# Patient Record
Sex: Female | Born: 1970
Health system: Southern US, Community
[De-identification: ages and names within clinical notes are randomized; demographics above are authoritative.]

## PROBLEM LIST (undated history)

## (undated) DIAGNOSIS — D649 Anemia, unspecified: Secondary | ICD-10-CM

## (undated) HISTORY — DX: Anemia, unspecified: D64.9

---

## 1999-01-28 ENCOUNTER — Other Ambulatory Visit: Admission: RE | Admit: 1999-01-28 | Discharge: 1999-01-28 | Payer: Self-pay | Admitting: Obstetrics and Gynecology

## 1999-07-05 ENCOUNTER — Inpatient Hospital Stay (HOSPITAL_COMMUNITY): Admission: AD | Admit: 1999-07-05 | Discharge: 1999-07-05 | Payer: Self-pay | Admitting: *Deleted

## 2006-12-14 ENCOUNTER — Emergency Department (HOSPITAL_COMMUNITY): Admission: EM | Admit: 2006-12-14 | Discharge: 2006-12-14 | Payer: Self-pay | Admitting: *Deleted

## 2008-09-02 ENCOUNTER — Emergency Department (HOSPITAL_COMMUNITY): Admission: EM | Admit: 2008-09-02 | Discharge: 2008-09-02 | Payer: Self-pay | Admitting: Emergency Medicine

## 2009-02-20 ENCOUNTER — Inpatient Hospital Stay (HOSPITAL_COMMUNITY): Admission: AD | Admit: 2009-02-20 | Discharge: 2009-02-21 | Payer: Self-pay | Admitting: Obstetrics and Gynecology

## 2010-07-08 ENCOUNTER — Emergency Department (HOSPITAL_COMMUNITY)
Admission: EM | Admit: 2010-07-08 | Discharge: 2010-07-08 | Payer: Self-pay | Source: Home / Self Care | Admitting: Emergency Medicine

## 2010-09-15 LAB — CK TOTAL AND CKMB (NOT AT ARMC): CK, MB: 0.6 ng/mL (ref 0.3–4.0)

## 2010-09-15 LAB — D-DIMER, QUANTITATIVE: D-Dimer, Quant: 0.3 ug/mL-FEU (ref 0.00–0.48)

## 2010-09-15 LAB — POCT PREGNANCY, URINE: Preg Test, Ur: NEGATIVE

## 2010-09-15 LAB — BASIC METABOLIC PANEL
BUN: 10 mg/dL (ref 6–23)
Chloride: 105 mEq/L (ref 96–112)
Creatinine, Ser: 0.77 mg/dL (ref 0.4–1.2)
GFR calc non Af Amer: >60 mL/min (ref >60)
Glucose, Bld: 97 mg/dL (ref 70–99)

## 2010-09-15 LAB — CBC
MCHC: 34.4 g/dL (ref 30.0–36.0)
MCV: 78.8 fL (ref 78.0–100.0)
Platelets: 322 10*3/uL (ref 150–400)
RDW: 14.7 % (ref 11.5–15.5)
WBC: 10.1 10*3/uL (ref 4.0–10.5)

## 2010-09-15 LAB — DIFFERENTIAL
Eosinophils Absolute: 0.1 10*3/uL (ref 0.0–0.7)
Eosinophils Relative: 1 % (ref 0–5)
Lymphs Abs: 1.4 10*3/uL (ref 0.7–4.0)
Monocytes Absolute: 1.3 10*3/uL — ABNORMAL HIGH (ref 0.1–1.0)
Monocytes Relative: 12 % (ref 3–12)

## 2010-09-15 LAB — URINALYSIS, ROUTINE W REFLEX MICROSCOPIC
Bilirubin Urine: NEGATIVE
Glucose, UA: NEGATIVE mg/dL
Hgb urine dipstick: NEGATIVE
Specific Gravity, Urine: 1.022 (ref 1.005–1.030)
pH: 6.5 (ref 5.0–8.0)

## 2010-10-11 LAB — ABO/RH: ABO/RH(D): O POS

## 2010-10-11 LAB — POCT PREGNANCY, URINE: Preg Test, Ur: POSITIVE

## 2010-10-11 LAB — WET PREP, GENITAL: Yeast Wet Prep HPF POC: NONE SEEN

## 2010-10-11 LAB — GC/CHLAMYDIA PROBE AMP, GENITAL
Chlamydia, DNA Probe: NEGATIVE
GC Probe Amp, Genital: NEGATIVE

## 2011-03-28 IMAGING — US US OB COMP LESS 14 WK
1 series · 14 of 21 positions shown · non-contrast
Comparison: none

CLINICAL DATA: Abdominal pain.  Bleeding.

OBSTETRIC <14 WK ULTRASOUND
TECHNIQUE: Transabdominal ultrasound was performed for evaluation
of the gestation as well as the maternal uterus and adnexal
regions.

[Series 1: us ob comp less 14 wk · 0.22mm/px · 14 of 21 slices shown]
[im 1/21]
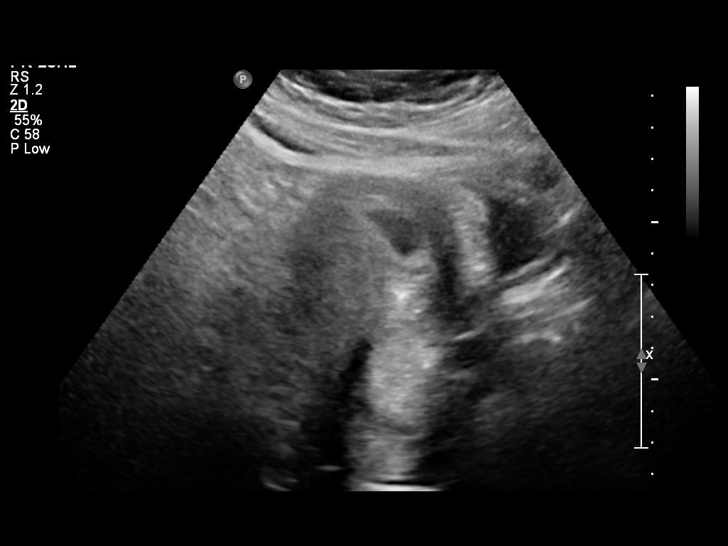
[im 3/21]
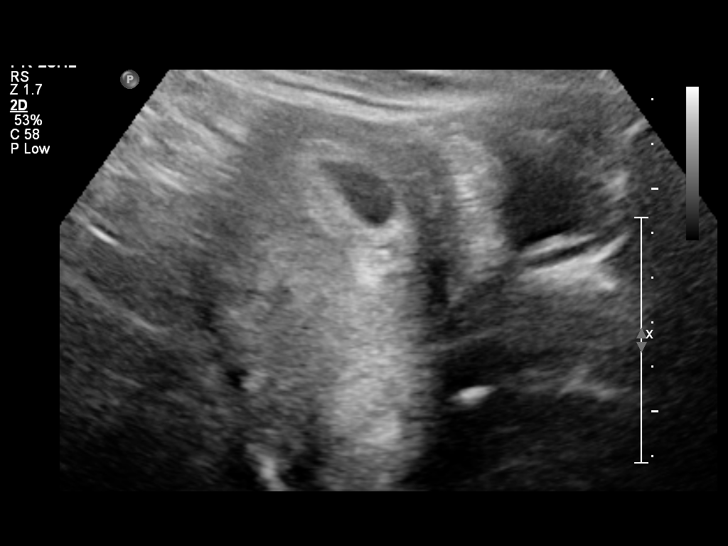
[im 4/21]
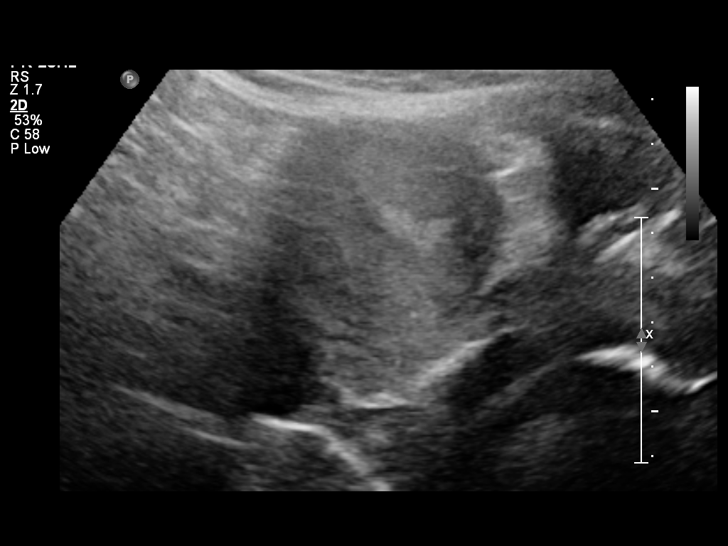
[im 6/21]
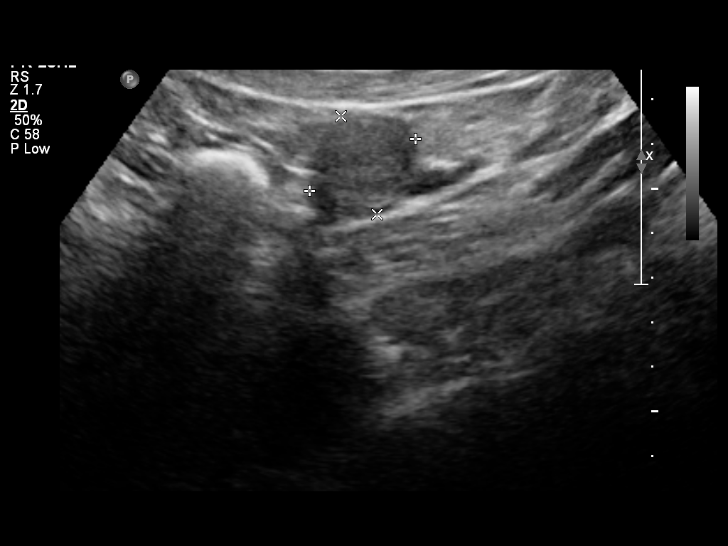
[im 7/21]
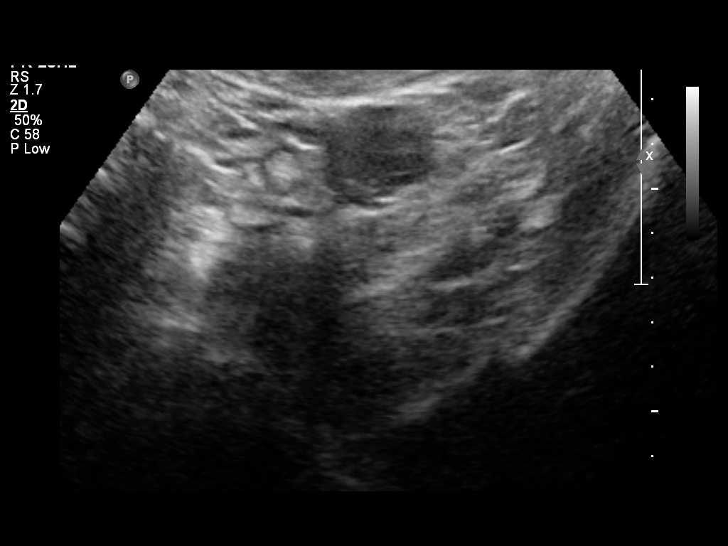
[im 9/21]
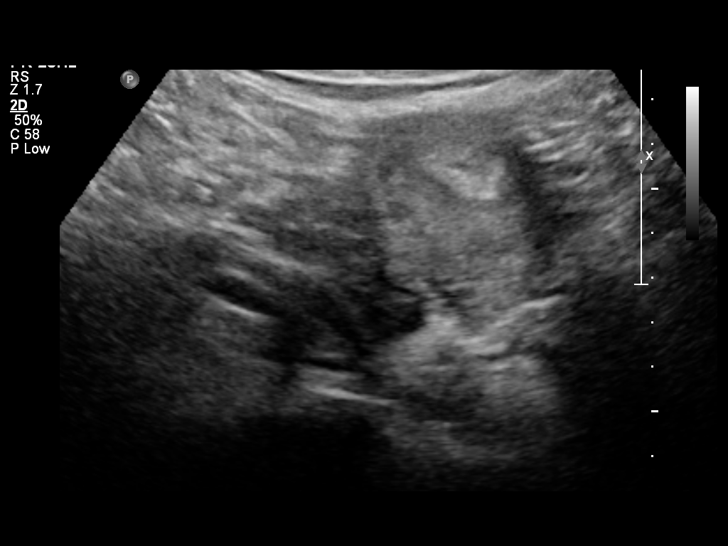
[im 10/21]
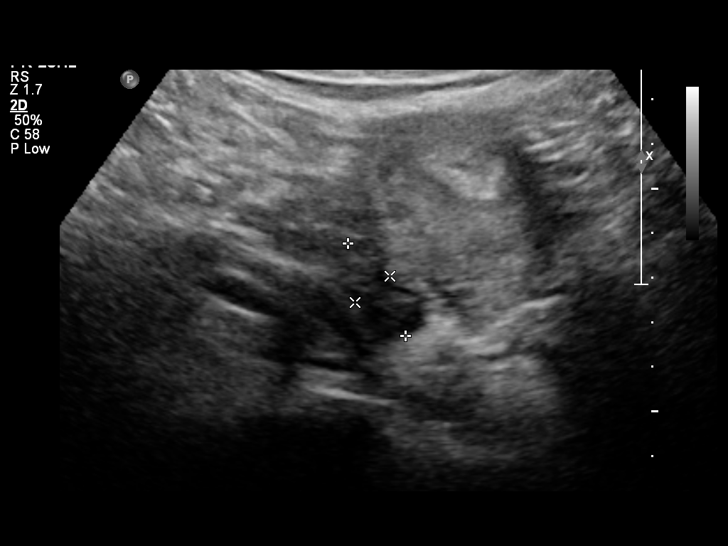
[im 12/21]
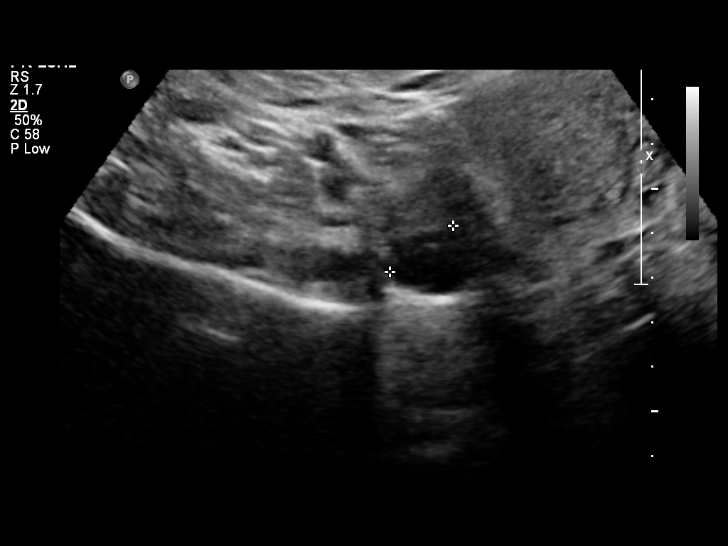
[im 13/21]
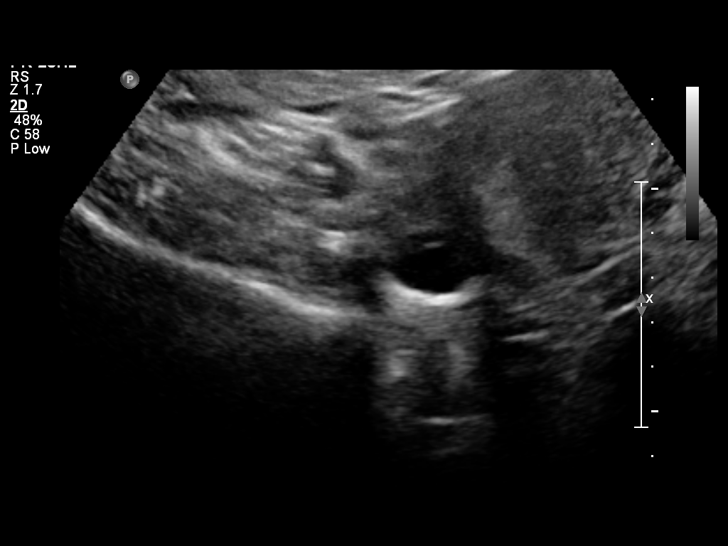
[im 15/21]
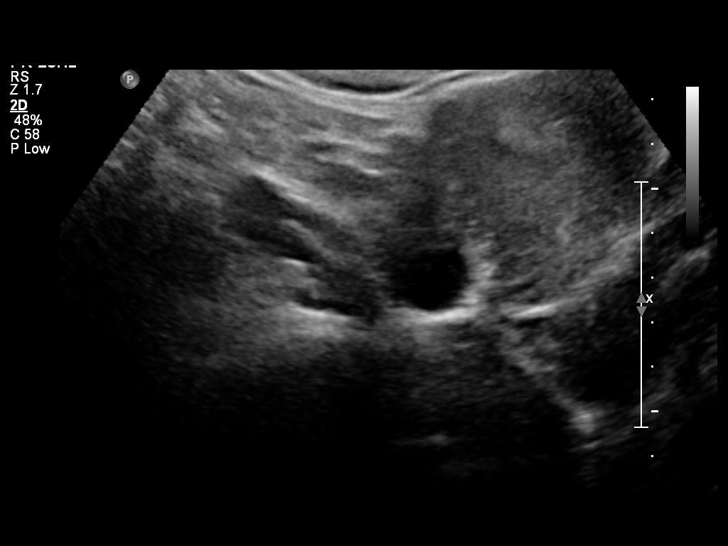
[im 16/21]
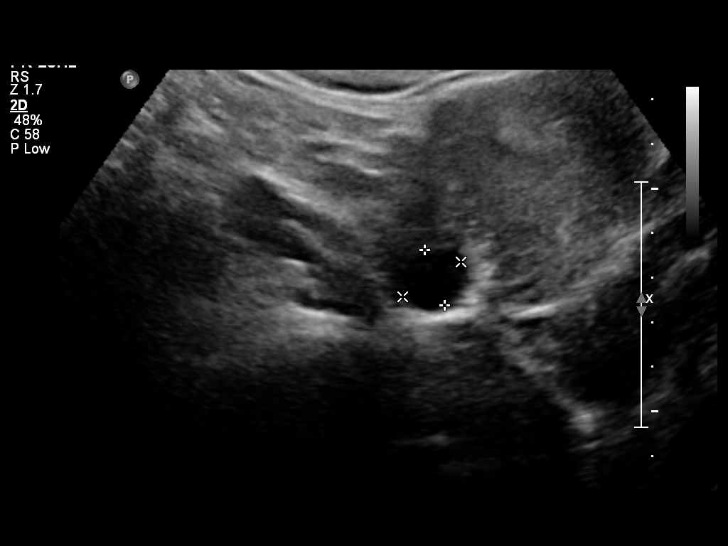
[im 18/21]
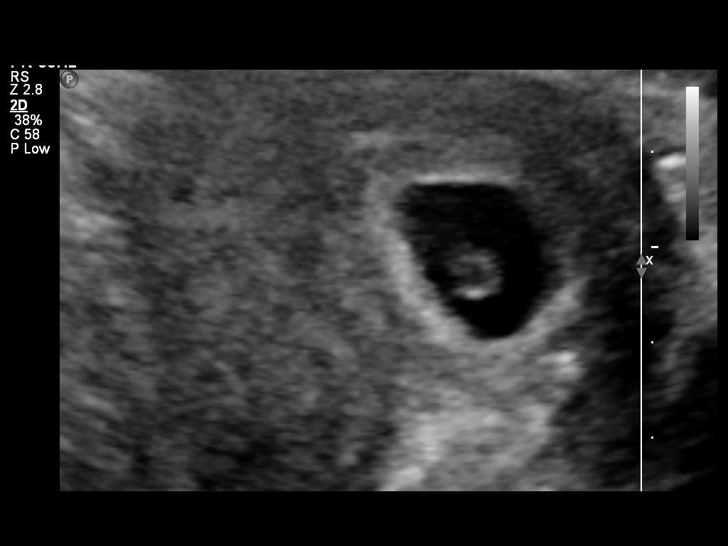
[im 19/21]
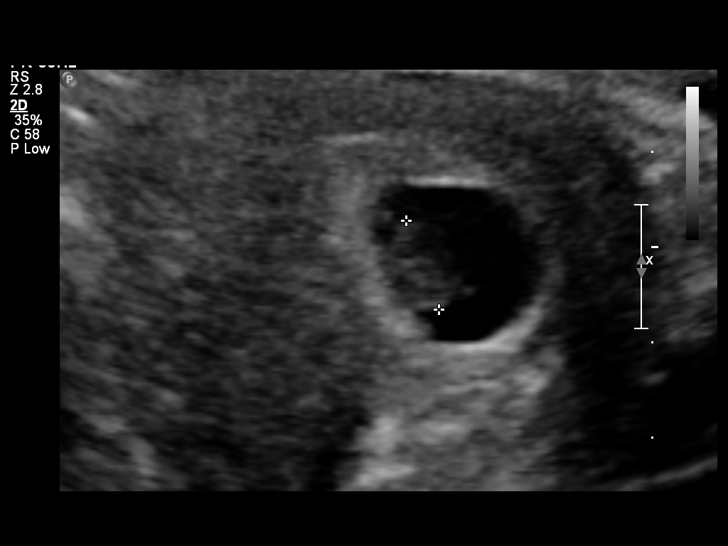
[im 21/21]
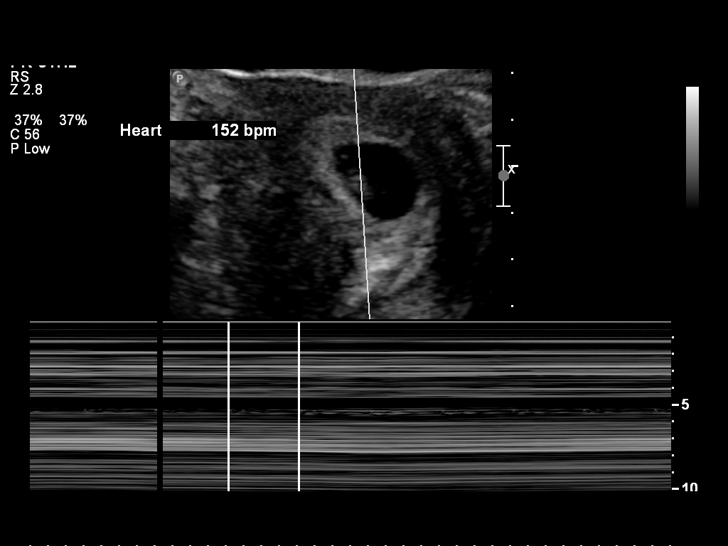

[14 of 21 positions shown; findings below may reference images not displayed]

FINDINGS: Quantitative beta HCG is unknown.  Single viable intrauterine
pregnancy is identified with fetal heart tones of 152 beats per
minute.  The crown-rump length of the fetus is 10 mm, corresponding
with 7 weeks 1 day.  Estimated date of delivery is 10/19/2009.

No subchorionic hemorrhage.  Physiologic appearance of the ovaries.
No free fluid.
IMPRESSION: Single viable intrauterine pregnancy with estimated gestational age
recommended, or sooner if clinically indicated.

## 2017-03-10 DIAGNOSIS — S138XXA Sprain of joints and ligaments of other parts of neck, initial encounter: Secondary | ICD-10-CM | POA: Diagnosis not present

## 2017-07-18 DIAGNOSIS — N760 Acute vaginitis: Secondary | ICD-10-CM | POA: Diagnosis not present

## 2017-07-18 DIAGNOSIS — M62838 Other muscle spasm: Secondary | ICD-10-CM | POA: Diagnosis not present

## 2017-08-29 DIAGNOSIS — R05 Cough: Secondary | ICD-10-CM | POA: Diagnosis not present

## 2017-11-02 ENCOUNTER — Ambulatory Visit (INDEPENDENT_AMBULATORY_CARE_PROVIDER_SITE_OTHER): Payer: 59 | Admitting: Urgent Care

## 2017-11-02 ENCOUNTER — Encounter: Payer: Self-pay | Admitting: Urgent Care

## 2017-11-02 VITALS — BP 120/80 | HR 79 | Temp 98.4°F | Resp 18 | Ht 64.5 in | Wt 216.2 lb

## 2017-11-02 DIAGNOSIS — N898 Other specified noninflammatory disorders of vagina: Secondary | ICD-10-CM

## 2017-11-02 DIAGNOSIS — M25469 Effusion, unspecified knee: Secondary | ICD-10-CM

## 2017-11-02 DIAGNOSIS — E669 Obesity, unspecified: Secondary | ICD-10-CM

## 2017-11-02 DIAGNOSIS — Z6836 Body mass index (BMI) 36.0-36.9, adult: Secondary | ICD-10-CM

## 2017-11-02 LAB — POCT WET + KOH PREP
TRICH BY WET PREP: ABSENT
YEAST BY KOH: ABSENT
YEAST BY WET PREP: ABSENT

## 2017-11-02 MED ORDER — METRONIDAZOLE 0.75 % VA GEL: 1.0000 | Freq: Two times a day (BID) | VAGINAL | 0 refills | Status: DC

## 2017-11-02 NOTE — Progress Notes (Signed)
MRN: [id number] DOB: 19-Sep-1970  Subjective:   Melissa Heath is a 47 y.o. female presenting for 1 week history of clear vaginal discharge.  She also reports several week history of intermittent focal swelling over her left knee.  Denies fever, nausea, vomiting, belly pain, joint swelling, redness, trauma, falls, history of arthritis, rashes, dysuria, urinary frequency, genital rash, flank pain, belly pain.  Patient reports that she has not been sexually active in some time.  She has had STI testing as soon as December of this past year and testing was negative.  She denies history of chronic BV.  She has a sedentary work style and although she denies pain of her left knee she does admit that she has stiffness when she sits for long periods of time.  He does not exercise.  She denies smoking cigarettes and has about 3 drinks of alcohol per week.  Melissa Heath is not currently taking any medications and has No Known Allergies.  Melissa Heath denies past medical and surgical history.  Her family history is positive for breast cancer in her mother and sister, lung cancer her maternal grandmother, pancreatic cancer in her paternal grandfather.  Patient's mother also has a history of severe gout in left knee.  Objective:   Vitals: BP 120/80   Pulse 79   Temp 98.4 F (36.9 C) (Oral)   Resp 18   Ht 5' 4.5" (1.638 m)   Wt 216 lb 3.2 oz (98.1 kg)   LMP 10/26/2017   SpO2 97%   BMI 36.54 kg/m   Physical Exam  Constitutional: She is oriented to person, place, and time. She appears well-developed and well-nourished.  HENT:  Mouth/Throat: Oropharynx is clear and moist.  Eyes: Pupils are equal, round, and reactive to light. EOM are normal. No scleral icterus.  Neck: Normal range of motion. Neck supple. No thyromegaly present.  Cardiovascular: Normal rate, regular rhythm and intact distal pulses. Exam reveals no gallop and no friction rub.  No murmur heard. Pulmonary/Chest: No respiratory distress. She has no wheezes.  She has no rales.  Abdominal: Soft. Bowel sounds are normal. She exhibits no distension and no mass. There is no tenderness. There is no rebound and no guarding.  Musculoskeletal:       Left knee: She exhibits normal range of motion, no swelling, no effusion, no ecchymosis, no deformity, no erythema, normal alignment, normal patellar mobility and no bony tenderness. No tenderness found.  Neurological: She is alert and oriented to person, place, and time.  Skin: Skin is warm and dry.  Psychiatric: She has a normal mood and affect.   Results for orders placed or performed in visit on 11/02/17 (from the past 24 hour(s))  POCT Wet + KOH Prep     Status: Abnormal   Collection Time: 11/02/17  8:25 AM  Result Value Ref Range   Yeast by KOH Absent Absent   Yeast by wet prep Absent Absent   WBC by wet prep Few Few   Clue Cells Wet Prep HPF POC Few (A) None   Trich by wet prep Absent Absent   Bacteria Wet Prep HPF POC Many (A) Few   Epithelial Cells By [employer]) Many (A) None, Few, Too numerous to count   RBC,UR,HPF,POC None None RBC/hpf    Assessment and Plan :   Vaginal discharge - Plan: POCT Wet + KOH Prep  Knee swelling  Class 2 obesity without serious comorbidity with body mass index (BMI) of 36.0 to 36.9  in adult, unspecified obesity type  Patient would like to use Flagyl vaginal inserts.  She declined STI testing.  Counseled that she should start exercise that is not burdensome to her knees including yoga, swimming, elliptical, bicycling.  She can use Tylenol and ibuprofen for any pain that she ends up developing.  For now we will hold off on any lab test.  Patient plans on returning to clinic for annual physical.  Melissa Bamberg, PA-C Primary [facility] at [facility] [phone] 11/02/2017  8:18 AM

## 2017-11-02 NOTE — Addendum Note (Signed)
Addended by: Wallis Bamberg on: 11/02/2017 08:58 AM   Modules accepted: Level of Service

## 2017-11-02 NOTE — Patient Instructions (Addendum)
Hydrate well with at least 2 liters (1 gallon) of water daily.    Vaginitis Vaginitis is a condition in which the vaginal tissue swells and becomes red (inflamed). This condition is most often caused by a change in the normal balance of bacteria and yeast that live in the vagina. This change causes an overgrowth of certain bacteria or yeast, which causes the inflammation. There are different types of vaginitis, but the most common types are:  Bacterial vaginosis.  Yeast infection (candidiasis).  Trichomoniasis vaginitis. This is a sexually transmitted disease (STD).  Viral vaginitis.  Atrophic vaginitis.  Allergic vaginitis.  What are the causes? The cause of this condition depends on the type of vaginitis. It can be caused by:  Bacteria (bacterial vaginosis).  Yeast, which is a fungus (yeast infection).  A parasite (trichomoniasis vaginitis).  A virus (viral vaginitis).  Low hormone levels (atrophic vaginitis). Low hormone levels can occur during pregnancy, breastfeeding, or after menopause.  Irritants, such as bubble baths, scented tampons, and feminine sprays (allergic vaginitis).  Other factors can change the normal balance of the yeast and bacteria that live in the vagina. These include:  Antibiotic medicines.  Poor hygiene.  Diaphragms, vaginal sponges, spermicides, birth control pills, and intrauterine devices (IUD).  Sex.  Infection.  Uncontrolled diabetes.  A weakened defense (immune) system.  What increases the risk? This condition is more likely to develop in women who:  Smoke.  Use vaginal douches, scented tampons, or scented sanitary pads.  Wear tight-fitting pants.  Wear thong underwear.  Use oral birth control pills or an IUD.  Have sex without a condom.  Have multiple sex partners.  Have an STD.  Frequently use the spermicide nonoxynol-9.  Eat lots of foods high in sugar.  Have uncontrolled diabetes.  Have low estrogen  levels.  Have a weakened immune system from an immune disorder or medical treatment.  Are pregnant or breastfeeding.  What are the signs or symptoms? Symptoms vary depending on the cause of the vaginitis. Common symptoms include:  Abnormal vaginal discharge. ? The discharge is white, gray, or yellow with bacterial vaginosis. ? The discharge is thick, white, and cheesy with a yeast infection. ? The discharge is frothy and yellow or greenish with trichomoniasis.  A bad vaginal smell. The smell is fishy with bacterial vaginosis.  Vaginal itching, pain, or swelling.  Sex that is painful.  Pain or burning when urinating.  Sometimes there are no symptoms. How is this diagnosed? This condition is diagnosed based on your symptoms and medical history. A physical exam, including a pelvic exam, will also be done. You may also have other tests, including:  Tests to determine the pH level (acidity or alkalinity) of your vagina.  A whiff test, to assess the odor that results when a sample of your vaginal discharge is mixed with a potassium hydroxide solution.  Tests of vaginal fluid. A sample will be examined under a microscope.  How is this treated? Treatment varies depending on the type of vaginitis you have. Your treatment may include:  Antibiotic creams or pills to treat bacterial vaginosis and trichomoniasis.  Antifungal medicines, such as vaginal creams or suppositories, to treat a yeast infection.  Medicine to ease discomfort if you have viral vaginitis. Your sexual partner should also be treated.  Estrogen delivered in a cream, pill, suppository, or vaginal ring to treat atrophic vaginitis. If vaginal dryness occurs, lubricants and moisturizing creams may help. You may need to avoid scented soaps, sprays, or douches.  Stopping use of a product that is causing allergic vaginitis. Then using a vaginal cream to treat the symptoms.  Follow these instructions at  home: Lifestyle  Keep your genital area clean and dry. Avoid soap, and only rinse the area with water.  Do not douche or use tampons until your health care provider says it is okay to do so. Use sanitary pads, if needed.  Do not have sex until your health care provider approves. When you can return to sex, practice safe sex and use condoms.  Wipe from front to back. This avoids the spread of bacteria from the rectum to the vagina. General instructions  Take over-the-counter and prescription medicines only as told by your health care provider.  If you were prescribed an antibiotic medicine, take or use it as told by your health care provider. Do not stop taking or using the antibiotic even if you start to feel better.  Keep all follow-up visits as told by your health care provider. This is important. How is this prevented?  Use mild, non-scented products. Do not use things that can irritate the vagina, such as fabric softeners. Avoid the following products if they are scented: ? Feminine sprays. ? Detergents. ? Tampons. ? Feminine hygiene products. ? Soaps or bubble baths.  Let air reach your genital area. ? Wear cotton underwear to reduce moisture buildup. ? Avoid wearing underwear while you sleep. ? Avoid wearing tight pants and underwear or nylons without a cotton panel. ? Avoid wearing thong underwear.  Take off any wet clothing, such as bathing suits, as soon as possible.  Practice safe sex and use condoms. Contact a health care provider if:  You have abdominal pain.  You have a fever.  You have symptoms that last for more than 2-3 days. Get help right away if:  You have a fever and your symptoms suddenly get worse. Summary  Vaginitis is a condition in which the vaginal tissue becomes inflamed.This condition is most often caused by a change in the normal balance of bacteria and yeast that live in the vagina.  Treatment varies depending on the type of vaginitis you  have.  Do not douche, use tampons , or have sex until your health care provider approves. When you can return to sex, practice safe sex and use condoms. This information is not intended to replace advice given to you by your health care provider. Make sure you discuss any questions you have with your health care provider. Document Released: 04/19/2007 Document Revised: 07/28/2016 Document Reviewed: 07/28/2016 Elsevier Interactive Patient Education  2018 [employer].      IF you received an x-ray today, you will receive an invoice from [facility] Radiology. Please contact [facility] at [phone] with questions or concerns regarding your invoice.   IF you received labwork today, you will receive an invoice from [place]. Please contact [facility] at [phone] with questions or concerns regarding your invoice.   Our billing staff will not be able to assist you with questions regarding bills from these companies.  You will be contacted with the lab results as soon as they are available. The fastest way to get your results is to activate your My Chart account. Instructions are located on the last page of this paperwork. If you have not heard from [place] regarding the results in 2 weeks, please contact this office.

## 2017-12-02 ENCOUNTER — Telehealth: Payer: Self-pay | Admitting: Urgent Care

## 2017-12-02 NOTE — Telephone Encounter (Signed)
Copied from CRM [id number]. Topic: Quick Communication - See Telephone Encounter >> Dec 02, 2017  3:42 PM Arlyss Gandy, NT wrote: CRM for notification. See Telephone encounter for: 12/02/17. Pt calling and states that she did not pick up the rx for metroNIDAZOLE (METROGEL) 0.75 % vaginal gel on 11/02/17 and states she went to get it today but it cost $45 and would like to see if something else can be ordered for her to take that would be more cost effective. Please advise.

## 2017-12-06 MED ORDER — METRONIDAZOLE 500 MG PO TABS: 500.0000 mg | ORAL_TABLET | Freq: Two times a day (BID) | ORAL | 0 refills | Status: DC

## 2017-12-06 NOTE — Telephone Encounter (Signed)
Phone call to patient. Unable to reach. If patient calls back, please let her know a oral medication to treat vaginal symptoms has been called in to [place] [facility] [place]- Metronidazole 500mg  tablet. Patient should not drink alcohol while taking this medication. If her symptoms do not improve, she needs to return for office visit.

## 2017-12-06 NOTE — Telephone Encounter (Signed)
Patient was seen more than a month ago. I don't mind sending in a script for Flagyl PO but patient can also come in for a recheck and should especially if her symptoms are worse.

## 2017-12-06 NOTE — Telephone Encounter (Signed)
Please advise 

## 2018-03-06 DIAGNOSIS — N3000 Acute cystitis without hematuria: Secondary | ICD-10-CM | POA: Diagnosis not present

## 2018-04-21 DIAGNOSIS — N760 Acute vaginitis: Secondary | ICD-10-CM | POA: Diagnosis not present

## 2018-07-17 DIAGNOSIS — L0231 Cutaneous abscess of buttock: Secondary | ICD-10-CM | POA: Diagnosis not present

## 2018-07-19 DIAGNOSIS — L0231 Cutaneous abscess of buttock: Secondary | ICD-10-CM | POA: Diagnosis not present

## 2021-07-04 ENCOUNTER — Encounter: Payer: Self-pay | Admitting: Gastroenterology

## 2021-08-21 ENCOUNTER — Ambulatory Visit (AMBULATORY_SURGERY_CENTER): Payer: 59 | Admitting: *Deleted

## 2021-08-21 ENCOUNTER — Other Ambulatory Visit: Payer: Self-pay

## 2021-08-21 VITALS — Ht 64.0 in | Wt 203.0 lb

## 2021-08-21 DIAGNOSIS — Z1211 Encounter for screening for malignant neoplasm of colon: Secondary | ICD-10-CM

## 2021-08-21 MED ORDER — NA SULFATE-K SULFATE-MG SULF 17.5-3.13-1.6 GM/177ML PO SOLN: 2.0000 | Freq: Once | ORAL | 0 refills | Status: AC

## 2021-08-21 NOTE — Progress Notes (Signed)
No egg or soy allergy known to patient  No issues known to pt with past sedation with any surgeries or procedures Patient denies ever being told they had issues or difficulty with intubation  No FH of Malignant Hyperthermia Pt is not on diet pills Pt is not on  home 02  Pt is not on blood thinners  Pt denies issues with constipation  No A fib or A flutter  Pt is fully vaccinated  for Covid    Due to the COVID-19 pandemic we are asking patients to follow certain guidelines in PV and the [place]   Pt aware of COVID protocols and [facility] guidelines   PV completed over the phone. Pt verified name, DOB, address and insurance during PV today.  Instructions emailed to pt.  Pt encouraged to call with questions or issues.  If pt has My chart, procedure instructions sent via My Chart

## 2021-09-04 ENCOUNTER — Encounter: Payer: Self-pay | Admitting: Gastroenterology

## 2021-09-17 ENCOUNTER — Ambulatory Visit (INDEPENDENT_AMBULATORY_CARE_PROVIDER_SITE_OTHER): Payer: 59 | Admitting: Gastroenterology

## 2021-09-17 ENCOUNTER — Encounter: Payer: Self-pay | Admitting: Gastroenterology

## 2021-09-17 VITALS — BP 110/70 | HR 84 | Ht 64.0 in | Wt 221.0 lb

## 2021-09-17 DIAGNOSIS — Z1211 Encounter for screening for malignant neoplasm of colon: Secondary | ICD-10-CM

## 2021-09-17 DIAGNOSIS — R194 Change in bowel habit: Secondary | ICD-10-CM

## 2021-09-17 MED ORDER — NA SULFATE-K SULFATE-MG SULF 17.5-3.13-1.6 GM/177ML PO SOLN: 1.0000 | Freq: Once | ORAL | 0 refills | Status: AC

## 2021-09-17 NOTE — Progress Notes (Signed)
? ?Referring Provider: Willeen Niece, PA ?Primary Care Physician:  Katherina Mires, MD ? ? ?Reason for Consultation:  Change in bowel habits ? ? ?IMPRESSION:  ?Change in bowel habits related to iron supplements ?Iron deficiency  ?No prior colon cancer screening ?Family history of pancreatic cancer (mother, no other family members) ? ?Discussed colon cancer screening options based on the USPTF guidelines. I had recommended my preference for colonoscopy given the 8% false negative rate and 14% false positive rate with Cologuard. But, some screening is preferred over no screening. May proceed with Cologuard if the patient prefers. She would prefer to proceed with colonoscopy. ? ?Altered bowel habits related to iron. Discussed using iron supplements every other day to minimize side effects without compromising absorption.  ? ?PLAN: ?- Colonoscopy for colon cancer screening ?- Discussed using iron supplements every other day to minimize side effects ? ? ?HPI: Melissa Heath is a 51 y.o. female who was referred by Dr. Doreene Nest for colonoscopy. However, the patient wanted to discuss colon cancer screening options. She works as a Corporate investment banker.  ? ?She is due colon cancer screening but has anxiety about colonoscopy and monitored anesthesia care. ? ?GI ROS is negative. However, she has anemia that has been attributed to heavy periods.  ?She has been consistently taking iron supplements for 3 months. Iron has resulted in constipation and a change in the odor of her stool.  ? ?No overt GI blood loss except for when the iron-induced constipation first started and she saw a little blood on the toilet paper. No melena, hematochezia. No epistaxis, hemoptysis, or hematuria.   ? ?She has concerns about anesthesia. She is concerned about not having control during the procedure.  ? ?Father with colon polyps but the pathology is unknown.  Mother with pancreatic cancer diagnosed 3 weeks ago at age 49. There is no other known family  history of colon cancer or polyps. No other family history of stomach cancer or other GI malignancy. No family history of inflammatory bowel disease or celiac.  ? ? ?Past Medical History:  ?Diagnosis Date  ? Anemia   ? ? ?History reviewed. No pertinent surgical history. ? ?Current Outpatient Medications  ?Medication Sig Dispense Refill  ? ferrous sulfate 325 (65 FE) MG tablet Take 325 mg by mouth 3 (three) times daily with meals.    ? meloxicam (MOBIC) 15 MG tablet Take 15 mg by mouth daily.    ? Na Sulfate-K Sulfate-Mg Sulf 17.5-3.13-1.6 GM/177ML SOLN Take 1 kit by mouth once for 1 dose. 354 mL 0  ? triamcinolone cream (KENALOG) 0.5 % Apply topically 2 (two) times daily.    ? ?No current facility-administered medications for this visit.  ? ? ?Allergies as of 09/17/2021  ? (No Known Allergies)  ? ? ?Family History  ?Problem Relation Age of Onset  ? Pancreatic cancer Mother   ? Colon polyps Father   ? Stroke Father   ? Colon cancer Neg Hx   ? Esophageal cancer Neg Hx   ? Stomach cancer Neg Hx   ? Rectal cancer Neg Hx   ? ? ?Social History  ? ?Socioeconomic History  ? Marital status: Single  ?  Spouse name: Not on file  ? Number of children: Not on file  ? Years of education: Not on file  ? Highest education level: Not on file  ?Occupational History  ? Not on file  ?Tobacco Use  ? Smoking status: Light Smoker  ? Smokeless tobacco: Never  ?  Substance and Sexual Activity  ? Alcohol use: Yes  ?  Comment: occasional  ? Drug use: Never  ? Sexual activity: Not on file  ?Other Topics Concern  ? Not on file  ?Social History Narrative  ? Not on file  ? ?Social Determinants of Health  ? ?Financial Resource Strain: Not on file  ?Food Insecurity: Not on file  ?Transportation Needs: Not on file  ?Physical Activity: Not on file  ?Stress: Not on file  ?Social Connections: Not on file  ?Intimate Partner Violence: Not on file  ? ? ?Review of Systems: ?12 system ROS is negative except as noted above with the addition of menstrual pain.   ? ?Physical Exam: ?General:   Alert,  well-nourished, pleasant and cooperative in NAD ?Head:  Normocephalic and atraumatic. ?Eyes:  Sclera clear, no icterus.   Conjunctiva pink. ?Ears:  Normal auditory acuity. ?Nose:  No deformity, discharge,  or lesions. ?Mouth:  No deformity or lesions.   ?Neck:  Supple; no masses or thyromegaly. ?Lungs:  Clear throughout to auscultation.   No wheezes. ?Heart:  Regular rate and rhythm; no murmurs. ?Abdomen:  Soft, nontender, nondistended, normal bowel sounds, no rebound or guarding. No hepatosplenomegaly.   ?Rectal:  Deferred  ?Msk:  Symmetrical. No boney deformities ?LAD: No inguinal or umbilical LAD ?Extremities:  No clubbing or edema. ?Neurologic:  Alert and  oriented x4;  grossly nonfocal ?Skin:  Intact without significant lesions or rashes. ?Psych:  Alert and cooperative. Normal mood and affect. ? ? ? ? ?Laveyah Oriol L. Tarri Glenn, MD, MPH ?09/17/2021, 4:19 PM ? ? ? ?  ?

## 2021-09-17 NOTE — Patient Instructions (Addendum)
I have recommended a colonoscopy for colon cancer screening.  ? ?Taking iron every other day may decrease your constipation and allow for more iron absorption.  ? ?Tips for colonoscopy:  ?- Stay well hydrated for 3-4 days prior to the exam. This reduces nausea and dehydration.  ?- To prevent skin/hemorrhoid irritation - prior to wiping, put A&Dointment or vaseline on the toilet paper. ?- Keep a towel or pad on the bed.  ?- Drink  64oz of clear liquids in the morning of prep day (prior to starting the prep) to be sure that there is enough fluid to flush the colon and stay hydrated!!!! This is in addition to the fluids required for preparation. ?- Use of a flavored hard candy, such as grape Anise Salvo, can counteract some of the flavor of the prep and may prevent some nausea.   ?

## 2021-10-07 ENCOUNTER — Encounter: Payer: 59 | Admitting: Gastroenterology

## 2021-11-06 ENCOUNTER — Telehealth: Payer: Self-pay | Admitting: Gastroenterology

## 2021-11-06 NOTE — Telephone Encounter (Signed)
Good Morning Dr. Orvan Falconer ? ?Patient called to reschedule her colonoscopy with you on 5/8 at 9:30 due to her nephew passing away unexpectedly. ? ?Patient was rescheduled for 6/20 at 9:30.     ?

## 2021-11-06 NOTE — Telephone Encounter (Signed)
Noted. Thanks.

## 2021-11-10 ENCOUNTER — Encounter: Payer: 59 | Admitting: Gastroenterology

## 2021-12-23 ENCOUNTER — Encounter: Payer: 59 | Admitting: Gastroenterology

## 2022-02-10 ENCOUNTER — Encounter: Payer: 59 | Admitting: Gastroenterology
